# Patient Record
Sex: Male | Born: 1975 | Race: Black or African American | Hispanic: No | Marital: Married | Smoking: Former smoker
Health system: Southern US, Community
[De-identification: ages and names within clinical notes are randomized; demographics above are authoritative.]

## PROBLEM LIST (undated history)

## (undated) DIAGNOSIS — I10 Essential (primary) hypertension: Secondary | ICD-10-CM

---

## 2000-06-25 ENCOUNTER — Encounter: Payer: Self-pay | Admitting: Emergency Medicine

## 2000-06-25 ENCOUNTER — Emergency Department (HOSPITAL_COMMUNITY): Admission: EM | Admit: 2000-06-25 | Discharge: 2000-06-25 | Payer: Self-pay | Admitting: Emergency Medicine

## 2000-11-14 ENCOUNTER — Encounter (INDEPENDENT_AMBULATORY_CARE_PROVIDER_SITE_OTHER): Payer: Self-pay | Admitting: *Deleted

## 2000-11-14 ENCOUNTER — Inpatient Hospital Stay (HOSPITAL_COMMUNITY): Admission: EM | Admit: 2000-11-14 | Discharge: 2000-11-16 | Payer: Self-pay

## 2000-11-18 ENCOUNTER — Emergency Department (HOSPITAL_COMMUNITY): Admission: EM | Admit: 2000-11-18 | Discharge: 2000-11-18 | Payer: Self-pay | Admitting: Emergency Medicine

## 2003-07-04 ENCOUNTER — Emergency Department (HOSPITAL_COMMUNITY): Admission: EM | Admit: 2003-07-04 | Discharge: 2003-07-04 | Payer: Self-pay | Admitting: Emergency Medicine

## 2005-02-22 ENCOUNTER — Emergency Department (HOSPITAL_COMMUNITY): Admission: EM | Admit: 2005-02-22 | Discharge: 2005-02-22 | Payer: Self-pay | Admitting: Emergency Medicine

## 2006-01-24 ENCOUNTER — Emergency Department (HOSPITAL_COMMUNITY): Admission: EM | Admit: 2006-01-24 | Discharge: 2006-01-24 | Payer: Self-pay | Admitting: Emergency Medicine

## 2006-11-17 ENCOUNTER — Emergency Department (HOSPITAL_COMMUNITY): Admission: EM | Admit: 2006-11-17 | Discharge: 2006-11-17 | Payer: Self-pay | Admitting: Emergency Medicine

## 2007-01-19 IMAGING — CT CT PELVIS W/ CM
1 of 4 series · 14 of 32 positions shown, 19 images · IV contrast (omnipaque)
Comparison: 07/04/03.

CLINICAL DATA: Abdominal and pelvic pain.   
 ABDOMEN CT WITH CONTRAST:
TECHNIQUE: Multidetector CT imaging of the abdomen was performed following the standard protocol during bolus administration of intravenous contrast.
 Contrast:   100 cc Omnipaque 300 IV.
TECHNIQUE: Multidetector CT imaging of the pelvis was performed following the standard protocol during bolus administration of intravenous contrast.

[Series 2: abd_pel 5.0 b40f st · axial · 0.77mm/px · z∈[+907,+1357]mm · 14 of 103 slices shown, 19 images]
[im 7/103  soft-tissue]
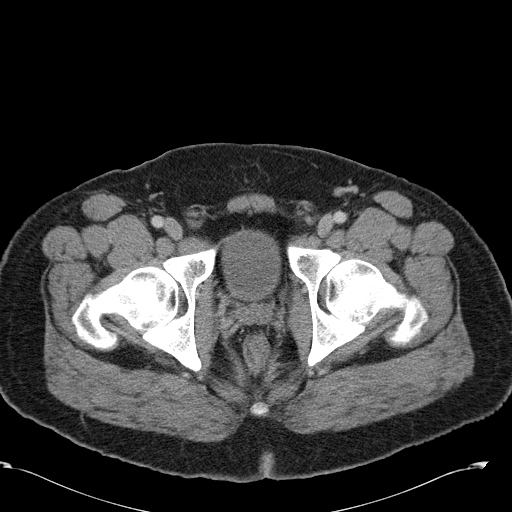
[im 7/103  bone]
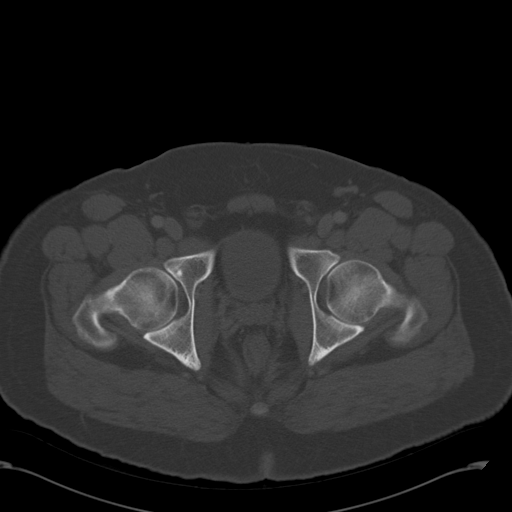
[im 13/103  soft-tissue]
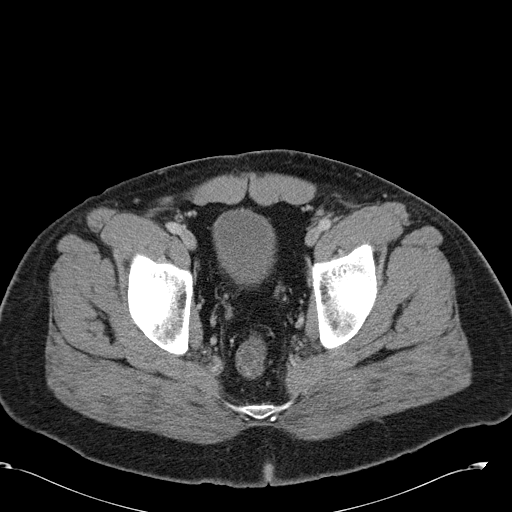
[im 25/103  soft-tissue]
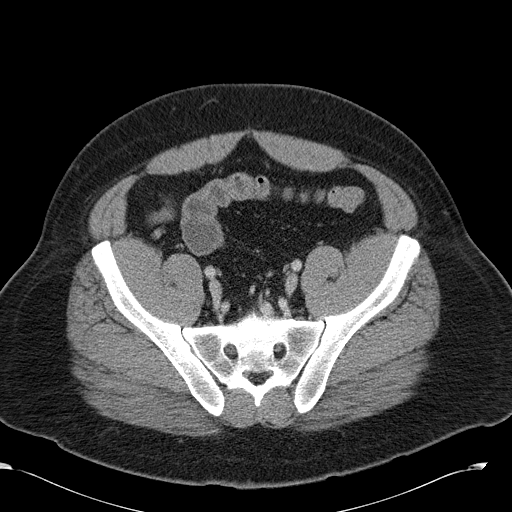
[im 31/103  soft-tissue]
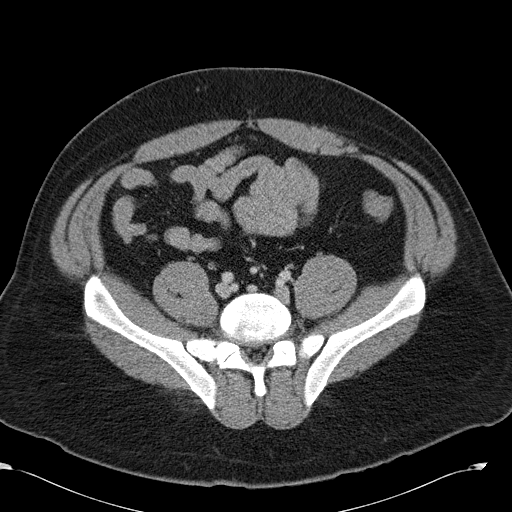
[im 37/103  soft-tissue]
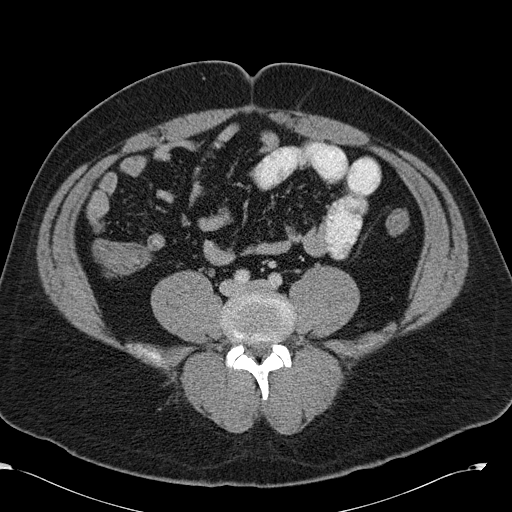
[im 43/103  soft-tissue]
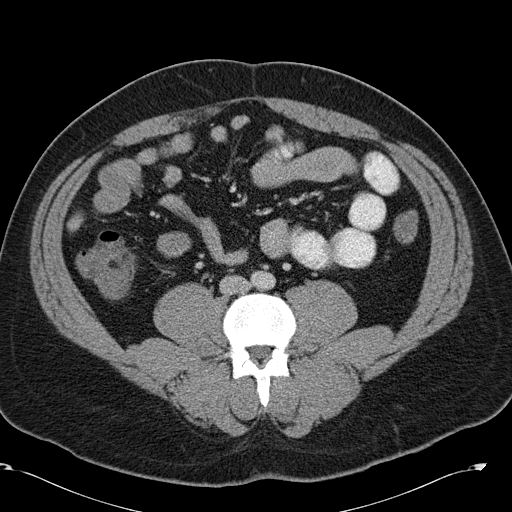
[im 55/103  soft-tissue]
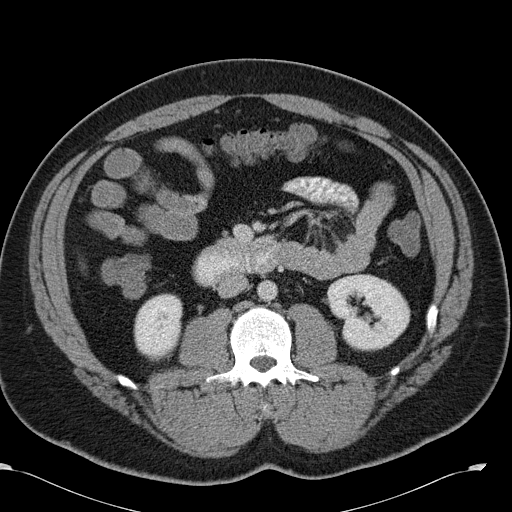
[im 61/103  soft-tissue]
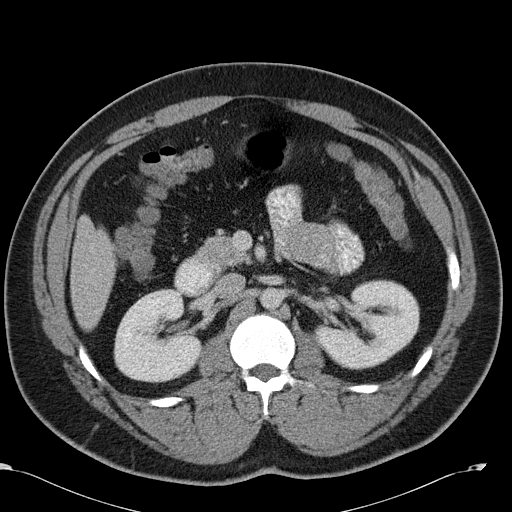
[im 67/103  soft-tissue]
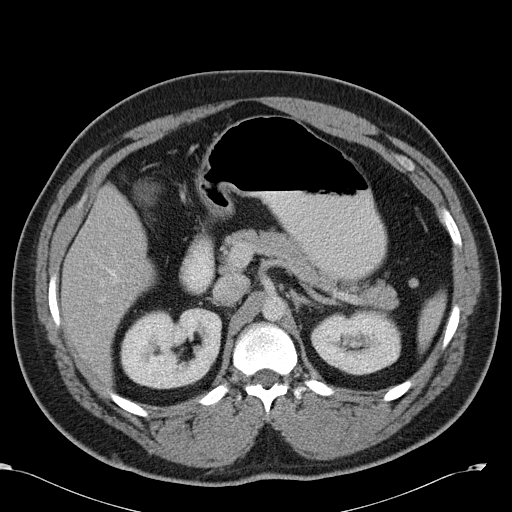
[im 67/103  bone]
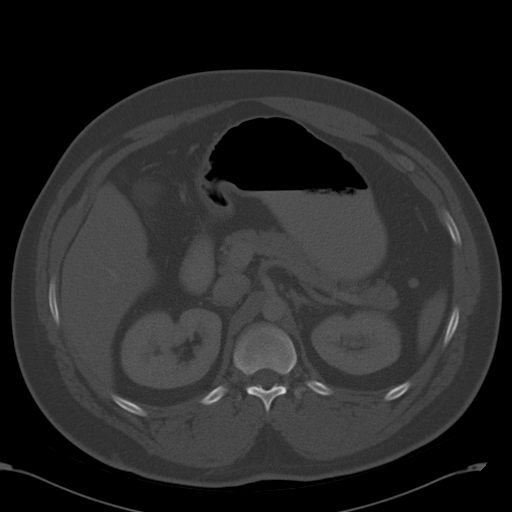
[im 73/103  soft-tissue]
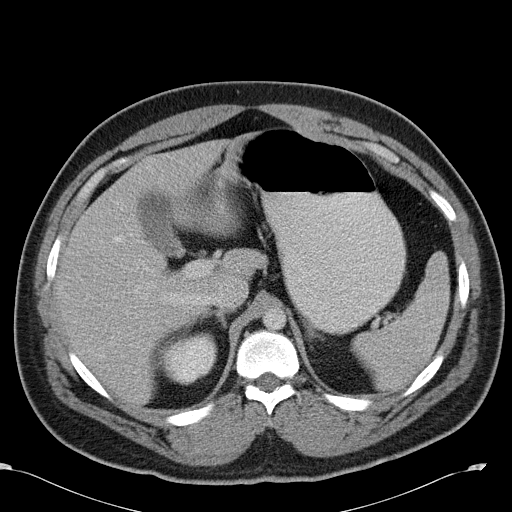
[im 79/103  soft-tissue]
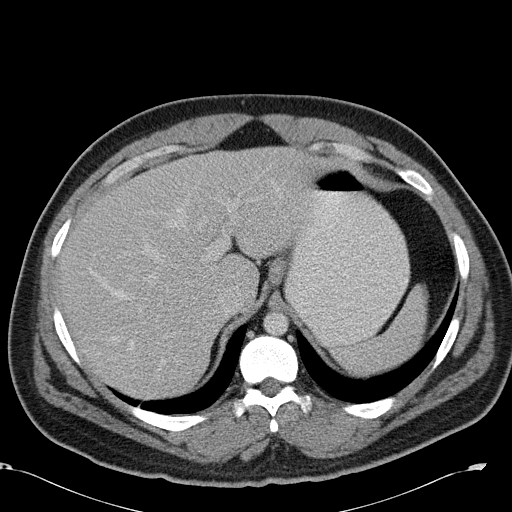
[im 79/103  lung]
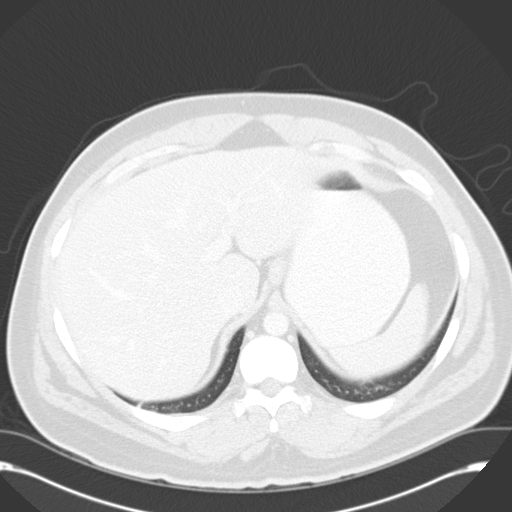
[im 85/103  lung]
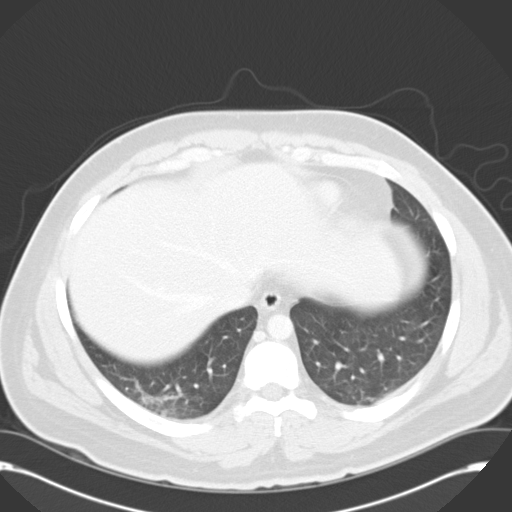
[im 91/103  soft-tissue]
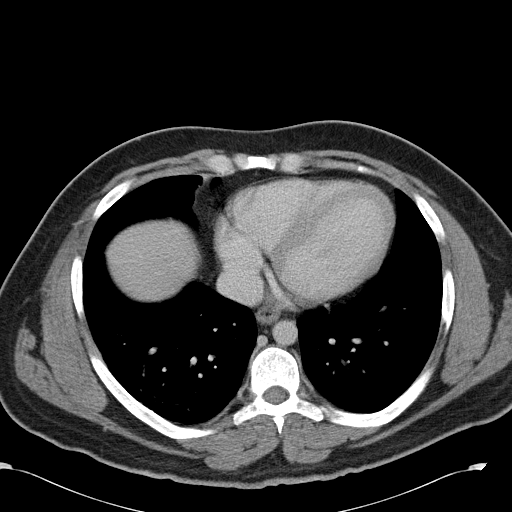
[im 91/103  lung]
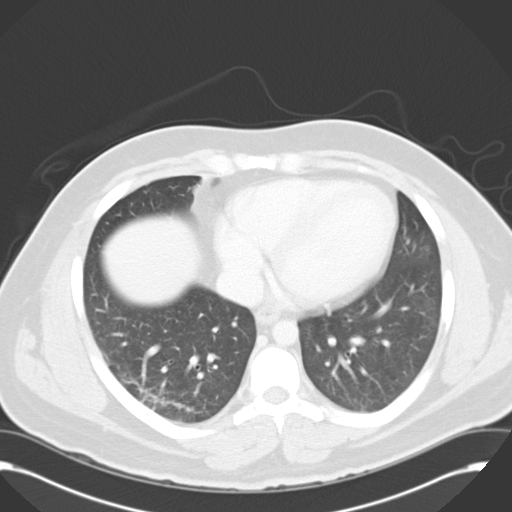
[im 97/103  soft-tissue]
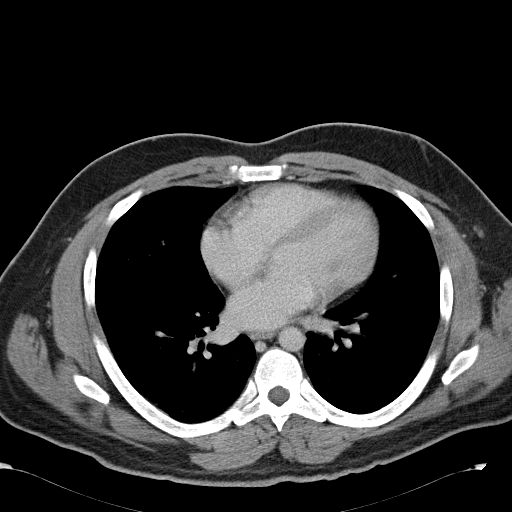
[im 97/103  lung]
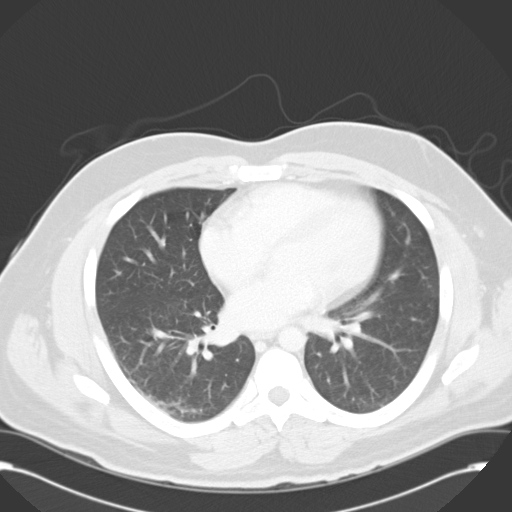

[14 of 32 positions shown; findings below may reference images not displayed]

FINDINGS: Mild bibasilar scarring is identified.  The liver, spleen, adrenal glands, gallbladder, pancreas, and kidneys are unremarkable.  No evidence of enlarged lymph nodes or free fluid.  There is no evidence of free fluid, enlarged lymph nodes, abdominal aortic aneurysm, or biliary dilatation.  The visualized bowel is within normal limits.
IMPRESSION: No acute abnormality.   
 PELVIS CT WITH CONTRAST:
FINDINGS: The bladder, bowel, and appendix are unremarkable.  There is no evidence of free fluid or enlarged lymph nodes.
IMPRESSION: No acute abnormality.

## 2007-12-21 IMAGING — US US ABDOMEN COMPLETE
1 series · 14 of 25 positions shown · non-contrast
Comparison: none

CLINICAL DATA: Abdominal pain. ER patient.
 ABDOMEN ULTRASOUND:
TECHNIQUE: Complete abdominal ultrasound examination was performed including evaluation of the liver, gallbladder, bile ducts, pancreas, kidneys, spleen, IVC, and abdominal aorta.

[Series 1: unknown · 0.37mm/px · 14 of 83 slices shown]
[im 1/83]
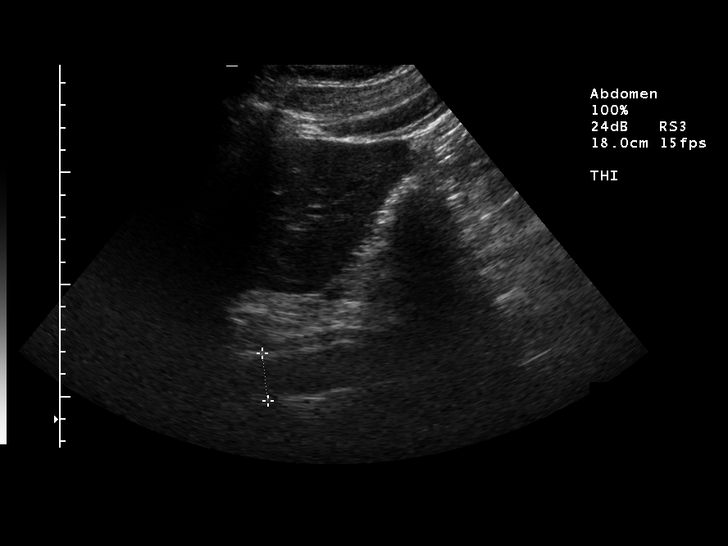
[im 7/83]
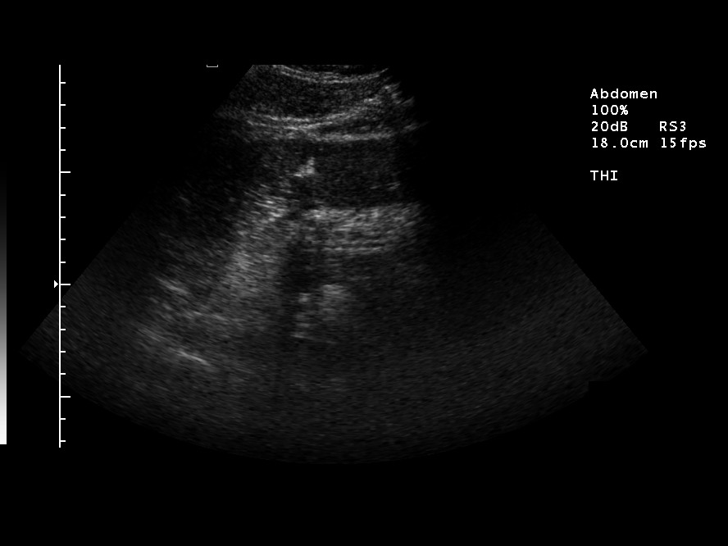
[im 14/83]
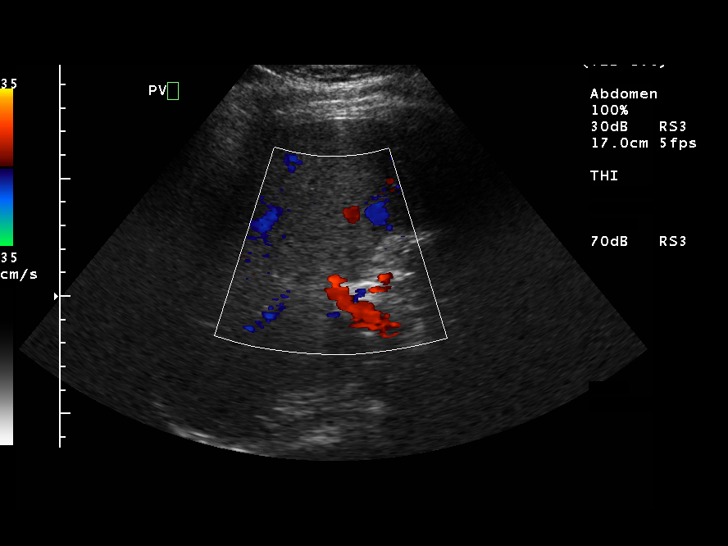
[im 21/83]
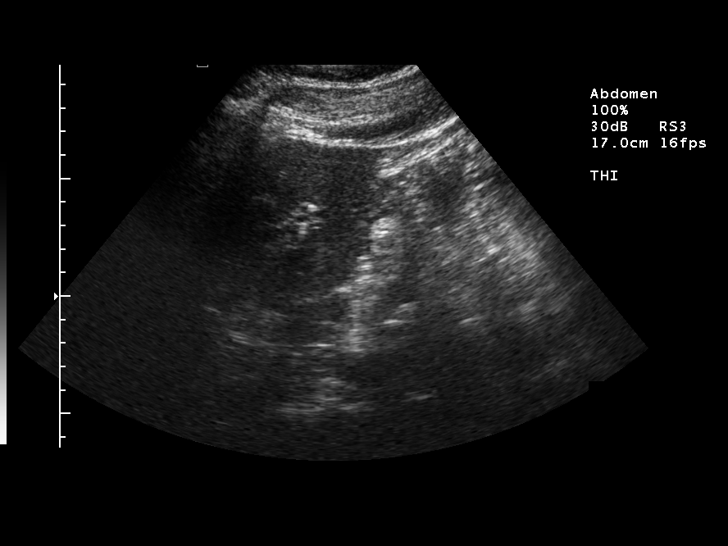
[im 28/83]
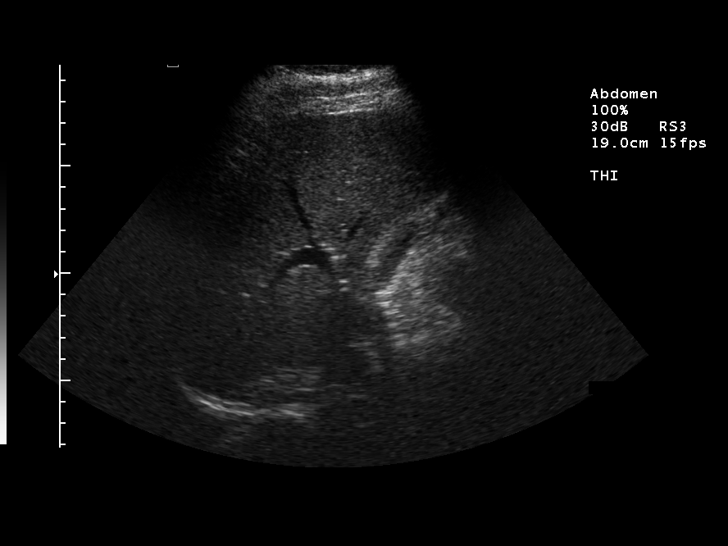
[im 31/83]
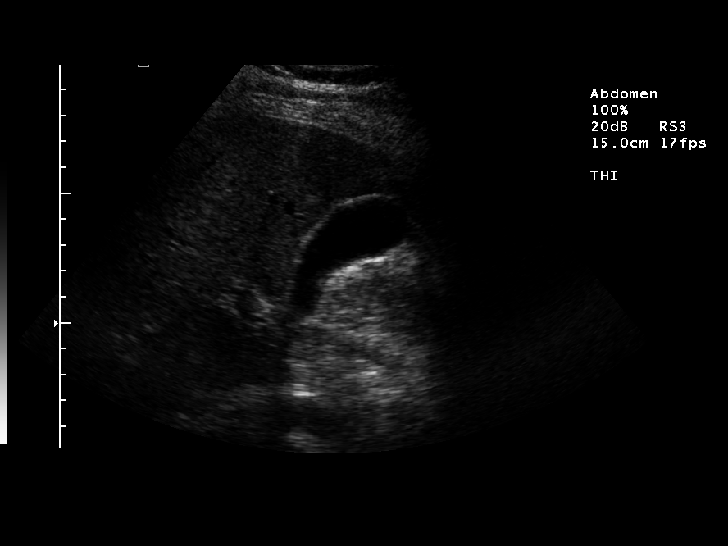
[im 38/83]
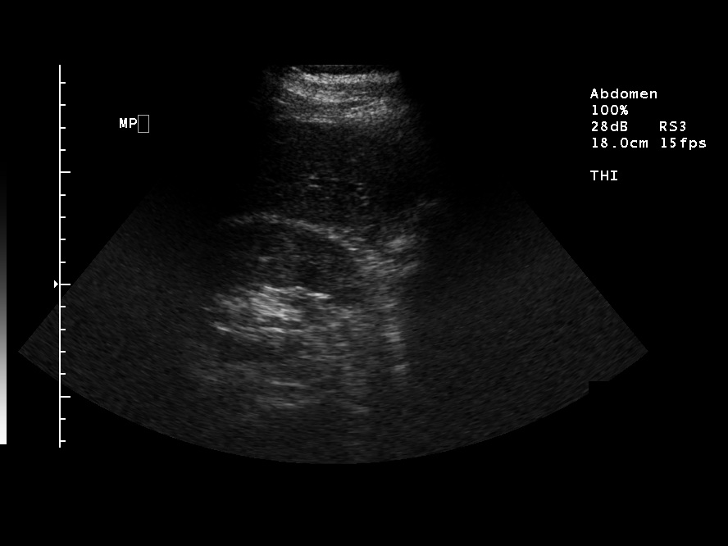
[im 45/83]
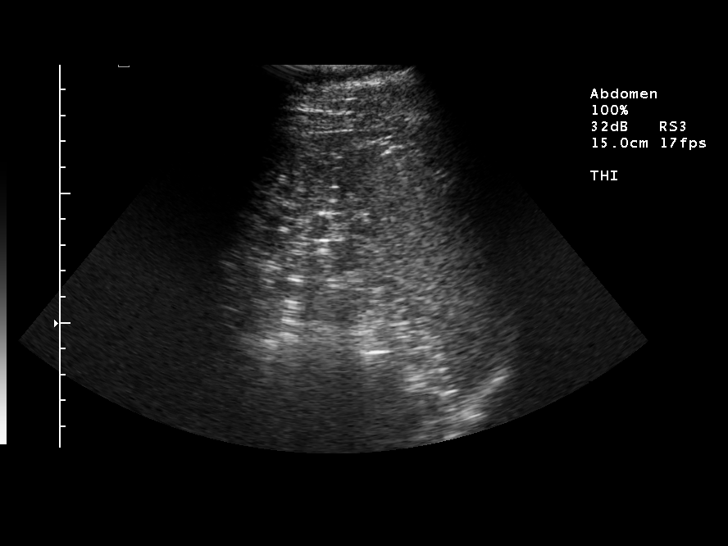
[im 52/83]
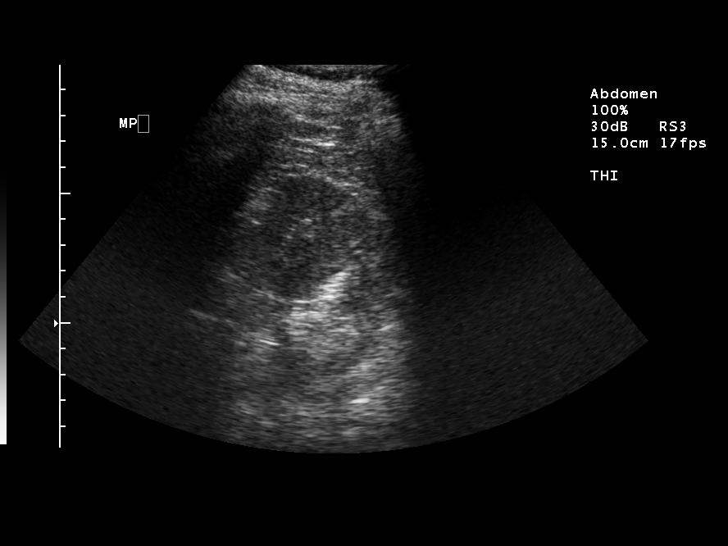
[im 55/83]
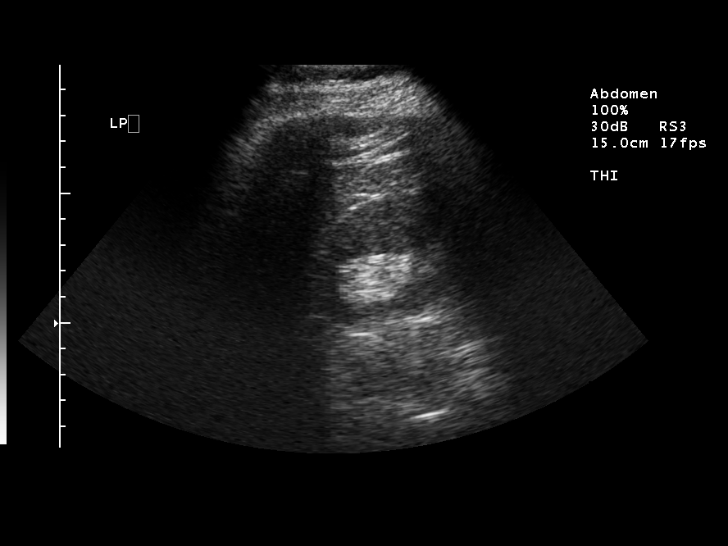
[im 62/83]
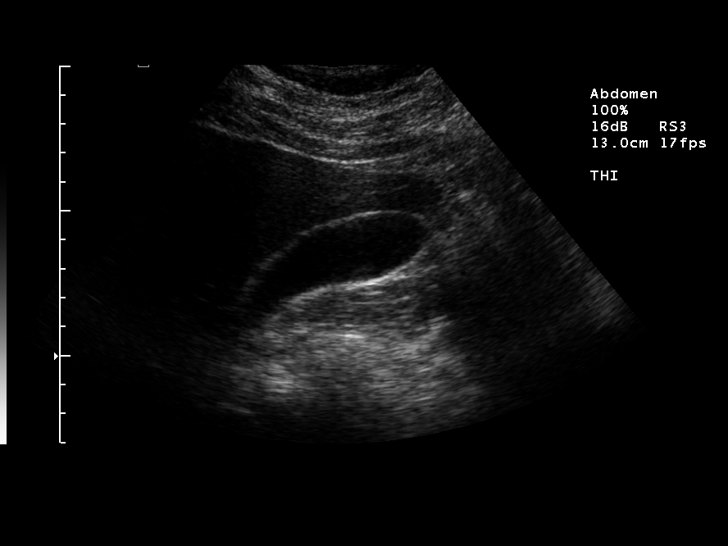
[im 69/83]
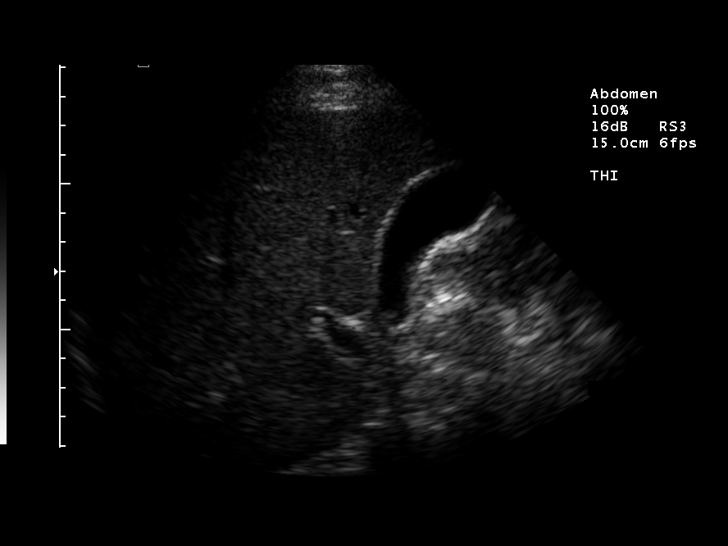
[im 76/83]
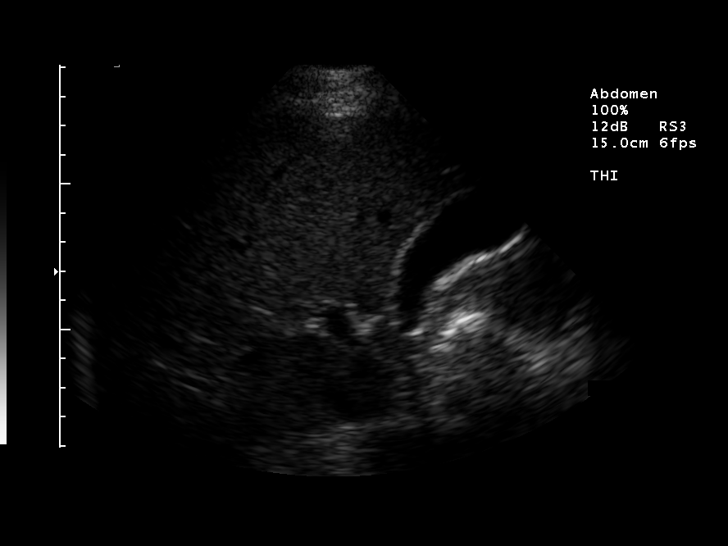
[im 83/83]
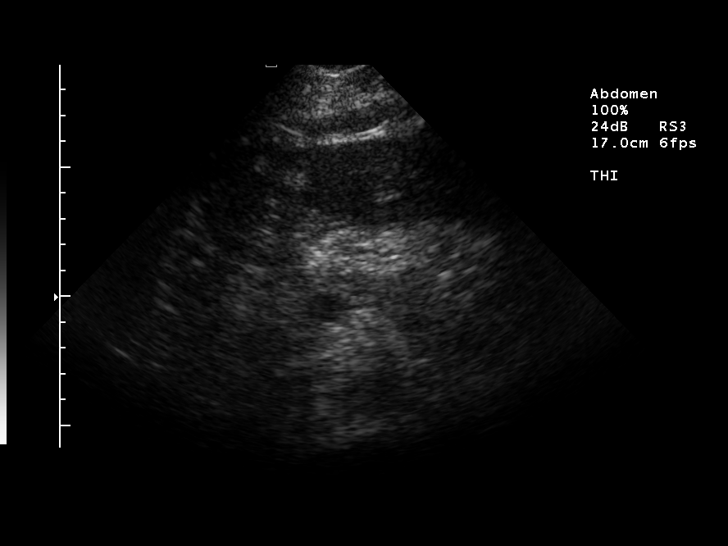

[14 of 25 positions shown; findings below may reference images not displayed]

FINDINGS: Normal gallbladder.  Gallbladder wall thickness is 2.1 mm. Common duct measures 3.0 mm which is within normal limits. No hepatic, splenic or renal abnormality.  The spleen measures 8.7 cm in diameter and appears unremarkable. Right and left kidneys measure 11.5 cm and 11.6 cm in length, respectively. There is poor visualization of the pancreas.  Patent IVC.  Maximum diameter of the abdominal aorta is 2.1 cm.
IMPRESSION: Negative abdominal ultrasound. However, note that the pancreas was suboptimally visualized.

## 2010-11-24 NOTE — H&P (Signed)
[place]  Patient:    Erik Dean, Erik Dean                      MRN: [id number] Adm. Date:  [id number] Attending:  Mingo Amber                         History and Physical  HISTORY OF PRESENT ILLNESS:  Erik Dean is a 35 year old male who presented to the emergency room with a three-day history of epigastric pain with vomiting of clear liquids and diarrhea which he describes as initially watery but becoming reddish in color. His usual bowel habits are one stool once or twice daily. He has not had fevers with this illness, and he describes no prior episodes of a similar problem. It is noted that he presented to the emergency room once in December with vomiting which he says was self-limited. X-rays at that time were unremarkable. He denies fevers, chills, weight loss, dysphagia, heartburn, or history of abdominal surgery. He describes pain in the mid abdomen which is colicky. This, on presentation to the emergency room, was described as a 10/10 and is currently a 6/10. He has received Demerol for discomfort.  PAST MEDICAL HISTORY:  Fairly unremarkable. He has never been hospitalized.  PAST SURGICAL HISTORY:  He has had no abdominal surgery.  CURRENT MEDICATIONS:  He is not taking any chronic medications.  ALLERGIES:  None reported.  FAMILY HISTORY:  Pertinent for a mother with diabetes. There is no known family history of ulcers, gallstones, inflammatory bowel disease, or gastrointestinal neoplasia.  SOCIAL HISTORY:  He is a part-time Corporate investment banker. He has a live-in girlfriend and a young daughter. He smokes a half of a pack of cigarettes per day and has a few beers per day. It is noted that his drug screen is positive for marijuana. He denies any other drug use at present.  REVIEW OF SYSTEMS:  GENERAL:  No weight loss or night sweats. ENDOCRINE:  No history of diabetes or thyroid problems. SKIN:  No rash or pruritus. EYES:   No icterus or change in vision. ENT:  No aphthous ulcers or chronic sore throat. RESPIRATORY:  No shortness of breath, cough, or wheezing. CARDIAC:  No chest pain, palpitations, or history of valvular heart disease. GI:  As above. GU: No dysuria or hematuria. The remainder of the review of systems is negative.  PHYSICAL EXAMINATION:  GENERAL:  He is an overweight, young adult male in no acute distress at this time.  VITAL SIGNS:  Afebrile, with a blood pressure of 139/74, pulse is 93, respiratory rate 18.  SKIN:  Normal.  HEENT:  Eyes are anicteric. Oropharynx is unremarkable.  NECK:  Supple without thyromegaly.  LYMPH NODES:  There is no cervical or inguinal adenopathy.  CHEST:  Clear.  CARDIAC:  Regular rate and rhythm without murmurs, rubs, or gallops.  ABDOMEN:  Obese with supraumbilical tenderness. I do not appreciate any mass or organomegaly. There is no rebound or hernia. There are active bowel sounds.  RECTAL:  Is not performed.  EXTREMITIES:  Without clubbing, cyanosis, edema, or rash.  LABORATORY DATA:  Hemoglobin of 16, white blood count of 14, platelet count is normal. Electrolytes are within normal limits, BUN 8, albumin 3.7, SGPT normal at 34. Drug screen is positive for marijuana. Urinalysis is normal.  IMPRESSION:  A 35 year old male with seeming acute colitis. This might be infectious in etiology, or  it could be onset of ulcerative colitis. His CT demonstrates mild distention of the small bowel with diffuse colonic wall thickening mostly in the ascending and transverse colon with a normal terminal ileum.  PLAN:  I am going to treat him with IV Tequin and oral Flagyl while awaiting stool cultures and C. difficile toxin titer. Colonoscopy is scheduled for the morning to rule out ulcerative colitis. Please see the orders. DD:  11/14/00 TD:  11/14/00 Job: [id number]

## 2013-09-29 ENCOUNTER — Encounter (HOSPITAL_COMMUNITY): Payer: Self-pay | Admitting: Emergency Medicine

## 2013-09-29 ENCOUNTER — Emergency Department (INDEPENDENT_AMBULATORY_CARE_PROVIDER_SITE_OTHER)
Admission: EM | Admit: 2013-09-29 | Discharge: 2013-09-29 | Disposition: A | Discharge status: Home / Self Care | Payer: Self-pay | Source: Home / Self Care | Attending: Emergency Medicine | Admitting: Emergency Medicine

## 2013-09-29 DIAGNOSIS — I10 Essential (primary) hypertension: Secondary | ICD-10-CM

## 2013-09-29 MED ORDER — AMLODIPINE BESYLATE 5 MG PO TABS: 5.0000 mg | ORAL_TABLET | Freq: Every day | ORAL | Status: DC

## 2013-09-29 NOTE — ED Provider Notes (Signed)
CSN: [id number]     Arrival date & time 09/29/13  1139 History   First MD Initiated Contact with Patient 09/29/13 1311     Chief Complaint  Patient presents with  . Hypertension   (Consider location/radiation/quality/duration/timing/severity/associated sxs/prior Treatment) HPI Comments: Was recently released from 7 years in prison on 09/06/2013 and has recently begun work at [employer]. Went for pre-employment evaluation and was informed that while all other component of his exam were normal, his blood pressure was elevated. So, he presents here for treatment of hypertension.  Reports himself to be asymptomatic.  Patient is a 38 y.o. male presenting with hypertension. The history is provided by the patient.  Hypertension This is a new problem. The problem occurs constantly. The problem has not changed since onset.Pertinent negatives include no chest pain, no abdominal pain, no headaches and no shortness of breath. Associated symptoms comments: No pedal edema.    History reviewed. No pertinent past medical history. History reviewed. No pertinent past surgical history. History reviewed. No pertinent family history. History  Substance Use Topics  . Smoking status: Not on file  . Smokeless tobacco: Not on file  . Alcohol Use: No    Review of Systems  Respiratory: Negative for shortness of breath.   Cardiovascular: Negative for chest pain.  Gastrointestinal: Negative for abdominal pain.  Neurological: Negative for headaches.  All other systems reviewed and are negative.    Allergies  Review of patient's allergies indicates no known allergies.  Home Medications   Current Outpatient Rx  Name  Route  Sig  Dispense  Refill  . amLODipine (NORVASC) 5 MG tablet   Oral   Take 1 tablet (5 mg total) by mouth daily.   30 tablet   1    BP 170/110  Pulse 78  Temp(Src) 98.6 F (37 C) (Oral)  Resp 16  SpO2 98% Physical Exam  Nursing note and vitals reviewed. Constitutional: He  is oriented to person, place, and time. He appears well-developed and well-nourished. No distress.  +overweight  HENT:  Head: Normocephalic and atraumatic.  Right Ear: External ear normal.  Left Ear: External ear normal.  Nose: Nose normal.  Eyes: Conjunctivae are normal. No scleral icterus.  Neck: Normal range of motion. Neck supple. No JVD present.  Cardiovascular: Normal rate, regular rhythm and normal heart sounds.   Pulmonary/Chest: Effort normal and breath sounds normal.  Abdominal: Bowel sounds are normal. He exhibits no distension. There is no tenderness.  Musculoskeletal: Normal range of motion. He exhibits no edema and no tenderness.  Neurological: He is alert and oriented to person, place, and time.  Skin: Skin is warm and dry.  Psychiatric: He has a normal mood and affect. His behavior is normal.    ED Course  Procedures (including critical care time) Labs Review Labs Reviewed - No data to display Imaging Review No results found.   MDM   1. Hypertension    Patient [facility]. Will initiate therapy with Norvasc and advise 7-10 day follow up for BP recheck.    Jess BartersJennifer Lee [place], [place] 09/29/13 1340

## 2013-09-29 NOTE — ED Notes (Signed)
Pt  Was  Told  That  His  Blood pressure  Was  High  While  Getting a  Physical  For a  Job     He  Displays  No  Symptoms

## 2013-09-29 NOTE — Discharge Instructions (Signed)
Arterial Hypertension  Arterial hypertension (high blood pressure) is a condition of elevated pressure in your blood vessels. Hypertension over a long period of time is a risk factor for strokes, heart attacks, and heart failure. It is also the leading cause of kidney (renal) failure.   CAUSES   · In Adults -- Over 90% of all hypertension has no known cause. This is called essential or primary hypertension. In the other 10% of people with hypertension, the increase in blood pressure is caused by another disorder. This is called secondary hypertension. Important causes of secondary hypertension are:  · Heavy alcohol use.  · Obstructive sleep apnea.  · Hyperaldosterosim (Conn's syndrome).  · Steroid use.  · Chronic kidney failure.  · Hyperparathyroidism.  · Medications.  · Renal artery stenosis.  · Pheochromocytoma.  · Cushing's disease.  · Coarctation of the aorta.  · Scleroderma renal crisis.  · Licorice (in excessive amounts).  · Drugs (cocaine, methamphetamine).  Your caregiver can explain any items above that apply to you.  · In Children -- Secondary hypertension is more common and should always be considered.  · Pregnancy -- Few women of childbearing age have high blood pressure. However, up to 10% of them develop hypertension of pregnancy. Generally, this will not harm the woman. It may be a sign of 3 complications of pregnancy: preeclampsia, HELLP syndrome, and eclampsia. Follow up and control with medication is necessary.  SYMPTOMS   · This condition normally does not produce any noticeable symptoms. It is usually found during a routine exam.  · Malignant hypertension is a late problem of high blood pressure. It may have the following symptoms:  · Headaches.  · Blurred vision.  · End-organ damage (this means your kidneys, heart, lungs, and other organs are being damaged).  · Stressful situations can increase the blood pressure. If a person with normal blood pressure has their blood pressure go up while being  seen by their caregiver, this is often termed "white coat hypertension." Its importance is not known. It may be related with eventually developing hypertension or complications of hypertension.  · Hypertension is often confused with mental tension, stress, and anxiety.  DIAGNOSIS   The diagnosis is made by 3 separate blood pressure measurements. They are taken at least 1 week apart from each other. If there is organ damage from hypertension, the diagnosis may be made without repeat measurements.  Hypertension is usually identified by having blood pressure readings:  · Above 140/90 mmHg measured in both arms, at 3 separate times, over a couple weeks.  · Over 130/80 mmHg should be considered a risk factor and may require treatment in patients with diabetes.  Blood pressure readings over 120/80 mmHg are called "pre-hypertension" even in non-diabetic patients.  To get a true blood pressure measurement, use the following guidelines. Be aware of the factors that can alter blood pressure readings.  · Take measurements at least 1 hour after caffeine.  · Take measurements 30 minutes after smoking and without any stress. This is another reason to quit smoking  it raises your blood pressure.  · Use a proper cuff size. Ask your caregiver if you are not sure about your cuff size.  · Most home blood pressure cuffs are automatic. They will measure systolic and diastolic pressures. The systolic pressure is the pressure reading at the start of sounds. Diastolic pressure is the pressure at which the sounds disappear. If you are elderly, measure pressures in multiple postures. Try sitting, lying or   standing.  · Sit at rest for a minimum of 5 minutes before taking measurements.  · You should not be on any medications like decongestants. These are found in many cold medications.  · Record your blood pressure readings and review them with your caregiver.  If you have hypertension:  · Your caregiver may do tests to be sure you do not have  secondary hypertension (see "causes" above).  · Your caregiver may also look for signs of metabolic syndrome. This is also called Syndrome X or Insulin Resistance Syndrome. You may have this syndrome if you have type 2 diabetes, abdominal obesity, and abnormal blood lipids in addition to hypertension.  · Your caregiver will take your medical and family history and perform a physical exam.  · Diagnostic tests may include blood tests (for glucose, cholesterol, potassium, and kidney function), a urinalysis, or an EKG. Other tests may also be necessary depending on your condition.  PREVENTION   There are important lifestyle issues that you can adopt to reduce your chance of developing hypertension:  · Maintain a normal weight.  · Limit the amount of salt (sodium) in your diet.  · Exercise often.  · Limit alcohol intake.  · Get enough potassium in your diet. Discuss specific advice with your caregiver.  · Follow a DASH diet (dietary approaches to stop hypertension). This diet is rich in fruits, vegetables, and low-fat dairy products, and avoids certain fats.  PROGNOSIS   Essential hypertension cannot be cured. Lifestyle changes and medical treatment can lower blood pressure and reduce complications. The prognosis of secondary hypertension depends on the underlying cause. Many people whose hypertension is controlled with medicine or lifestyle changes can live a normal, healthy life.   RISKS AND COMPLICATIONS   While high blood pressure alone is not an illness, it often requires treatment due to its short- and long-term effects on many organs. Hypertension increases your risk for:  · CVAs or strokes (cerebrovascular accident).  · Heart failure due to chronically high blood pressure (hypertensive cardiomyopathy).  · Heart attack (myocardial infarction).  · Damage to the retina (hypertensive retinopathy).  · Kidney failure (hypertensive nephropathy).  Your caregiver can explain list items above that apply to you. Treatment  of hypertension can significantly reduce the risk of complications.  TREATMENT   · For overweight patients, weight loss and regular exercise are recommended. Physical fitness lowers blood pressure.  · Mild hypertension is usually treated with diet and exercise. A diet rich in fruits and vegetables, fat-free dairy products, and foods low in fat and salt (sodium) can help lower blood pressure. Decreasing salt intake decreases blood pressure in a 1/3 of people.  · Stop smoking if you are a smoker.  The steps above are highly effective in reducing blood pressure. While these actions are easy to suggest, they are difficult to achieve. Most patients with moderate or severe hypertension end up requiring medications to bring their blood pressure down to a normal level. There are several classes of medications for treatment. Blood pressure pills (antihypertensives) will lower blood pressure by their different actions. Lowering the blood pressure by 10 mmHg may decrease the risk of complications by as much as 25%.  The goal of treatment is effective blood pressure control. This will reduce your risk for complications. Your caregiver will help you determine the best treatment for you according to your lifestyle. What is excellent treatment for one person, may not be for you.  HOME CARE INSTRUCTIONS   · Do not   smoke.  · Follow the lifestyle changes outlined in the "Prevention" section.  · If you are on medications, follow the directions carefully. Blood pressure medications must be taken as prescribed. Skipping doses reduces their benefit. It also puts you at risk for problems.  · Follow up with your caregiver, as directed.  · If you are asked to monitor your blood pressure at home, follow the guidelines in the "Diagnosis" section above.  SEEK MEDICAL CARE IF:   · You think you are having medication side effects.  · You have recurrent headaches or lightheadedness.  · You have swelling in your ankles.  · You have trouble with  your vision.  SEEK IMMEDIATE MEDICAL CARE IF:   · You have sudden onset of chest pain or pressure, difficulty breathing, or other symptoms of a heart attack.  · You have a severe headache.  · You have symptoms of a stroke (such as sudden weakness, difficulty speaking, difficulty walking).  MAKE SURE YOU:   · Understand these instructions.  · Will watch your condition.  · Will get help right away if you are not doing well or get worse.  Document Released: 06/25/2005 Document Revised: 09/17/2011 Document Reviewed: 01/23/2007  ExitCare® Patient Information ©2014 [employer].  DASH Diet  The DASH diet stands for "Dietary Approaches to Stop Hypertension." It is a healthy eating plan that has been shown to reduce high blood pressure (hypertension) in as little as 14 days, while also possibly providing other significant health benefits. These other health benefits include reducing the risk of breast cancer after menopause and reducing the risk of type 2 diabetes, heart disease, colon cancer, and stroke. Health benefits also include weight loss and slowing kidney failure in patients with chronic kidney disease.   DIET GUIDELINES  · Limit salt (sodium). Your diet should contain less than 1500 mg of sodium daily.  · Limit refined or processed carbohydrates. Your diet should include mostly whole grains. Desserts and added sugars should be used sparingly.  · Include small amounts of heart-healthy fats. These types of fats include nuts, oils, and tub margarine. Limit saturated and trans fats. These fats have been shown to be harmful in the body.  CHOOSING FOODS   The following food groups are based on a 2000 calorie diet. See your Registered Dietitian for individual calorie needs.  Grains and Grain Products (6 to 8 servings daily)  · Eat More Often: Whole-wheat bread, brown rice, whole-grain or wheat pasta, quinoa, popcorn without added fat or salt (air popped).  · Eat Less Often: White bread, white pasta, white rice,  cornbread.  Vegetables (4 to 5 servings daily)  · Eat More Often: Fresh, frozen, and canned vegetables. Vegetables may be raw, steamed, roasted, or grilled with a minimal amount of fat.  · Eat Less Often/Avoid: Creamed or fried vegetables. Vegetables in a cheese sauce.  Fruit (4 to 5 servings daily)  · Eat More Often: All fresh, canned (in natural juice), or frozen fruits. Dried fruits without added sugar. One hundred percent fruit juice (½ cup [237 mL] daily).  · Eat Less Often: Dried fruits with added sugar. Canned fruit in light or heavy syrup.  Lean Meats, Fish, and Poultry (2 servings or less daily. One serving is 3 to 4 oz [85-114 g]).  · Eat More Often: Ninety percent or leaner ground beef, tenderloin, sirloin. Round cuts of beef, chicken breast, turkey breast. All fish. Grill, bake, or broil your meat. Nothing should be fried.  · Eat   Less Often/Avoid: Fatty cuts of meat, turkey, or chicken leg, thigh, or wing. Fried cuts of meat or fish.  Dairy (2 to 3 servings)  · Eat More Often: Low-fat or fat-free milk, low-fat plain or light yogurt, reduced-fat or part-skim cheese.  · Eat Less Often/Avoid: Milk (whole, 2%). Whole milk yogurt. Full-fat cheeses.  Nuts, Seeds, and Legumes (4 to 5 servings per week)  · Eat More Often: All without added salt.  · Eat Less Often/Avoid: Salted nuts and seeds, canned beans with added salt.  Fats and Sweets (limited)  · Eat More Often: Vegetable oils, tub margarines without trans fats, sugar-free gelatin. Mayonnaise and salad dressings.  · Eat Less Often/Avoid: Coconut oils, palm oils, butter, stick margarine, cream, half and half, cookies, candy, pie.  FOR MORE INFORMATION  The Dash Diet Eating Plan: www.dashdiet.org  Document Released: 06/14/2011 Document Revised: 09/17/2011 Document Reviewed: 06/14/2011  ExitCare® Patient Information ©2014 [employer].

## 2013-09-29 NOTE — ED Provider Notes (Signed)
Medical screening examination/treatment/procedure(s) were performed by non-physician practitioner and as supervising physician I was immediately available for consultation/collaboration.  Latrel Szymczak, M.D.  Timira Bieda C Zanaiya Calabria, MD 09/29/13 1807 

## 2013-10-13 ENCOUNTER — Emergency Department (INDEPENDENT_AMBULATORY_CARE_PROVIDER_SITE_OTHER)
Admission: EM | Admit: 2013-10-13 | Discharge: 2013-10-13 | Disposition: A | Discharge status: Home / Self Care | Payer: Self-pay | Source: Home / Self Care | Attending: Family Medicine | Admitting: Family Medicine

## 2013-10-13 ENCOUNTER — Encounter (HOSPITAL_COMMUNITY): Payer: Self-pay | Admitting: Emergency Medicine

## 2013-10-13 DIAGNOSIS — I10 Essential (primary) hypertension: Secondary | ICD-10-CM

## 2013-10-13 HISTORY — DX: Essential (primary) hypertension: I10

## 2013-10-13 MED ORDER — LISINOPRIL-HYDROCHLOROTHIAZIDE 10-12.5 MG PO TABS: 1.0000 | ORAL_TABLET | Freq: Every day | ORAL | Status: AC

## 2013-10-13 NOTE — Discharge Instructions (Signed)
Take medicine as prescribed and see your doctor for refills and bp clearance for work.

## 2013-10-13 NOTE — ED Notes (Signed)
Erik Dean  Spoke   With  Pt     Regarding plan of  Care

## 2013-10-13 NOTE — ED Notes (Signed)
Dr Zollie Scalekindl  Spoke   With the   Doctor at   [facility]up[facility]    Regarding  pts  Plan of  Care       Pt  Has       A   Document    Dated    18  March    That  He  Is  Requesting     To  Have  Filled out that     Dr  Artis FlockKindl  Says     Needs  To be filled  Out  By a  PCP

## 2013-10-13 NOTE — ED Provider Notes (Signed)
CSN: [id number]     Arrival date & time 10/13/13  0850 History   First MD Initiated Contact with Patient 10/13/13 [id number]     Chief Complaint  Patient presents with  . Hypertension   (Consider location/radiation/quality/duration/timing/severity/associated sxs/prior Treatment) Patient is a 38 y.o. male presenting with hypertension. The history is provided by the patient.  Hypertension This is a new problem. The current episode started more than 1 week ago (seen and treated 3/24 for bp, here for recheck.). The problem has not changed since onset.Pertinent negatives include no chest pain, no headaches and no shortness of breath.    No past medical history on file. No past surgical history on file. No family history on file. History  Substance Use Topics  . Smoking status: Not on file  . Smokeless tobacco: Not on file  . Alcohol Use: No    Review of Systems  Constitutional: Negative.   Respiratory: Negative.  Negative for shortness of breath.   Cardiovascular: Negative.  Negative for chest pain, palpitations and leg swelling.  Neurological: Negative for headaches.    Allergies  Review of patient's allergies indicates no known allergies.  Home Medications   Current Outpatient Rx  Name  Route  Sig  Dispense  Refill  . amLODipine (NORVASC) 5 MG tablet   Oral   Take 1 tablet (5 mg total) by mouth daily.   30 tablet   1   . lisinopril-hydrochlorothiazide (PRINZIDE,ZESTORETIC) 10-12.5 MG per tablet   Oral   Take 1 tablet by mouth daily.   30 tablet   1    BP 152/102  Pulse 92  Temp(Src) 97.3 F (36.3 C) (Oral)  Resp 18  SpO2 98% Physical Exam  Nursing note and vitals reviewed. Constitutional: He appears well-developed and well-nourished.  Neck: Normal range of motion. Neck supple.  Cardiovascular: Normal rate, normal heart sounds and intact distal pulses.   Pulmonary/Chest: Effort normal and breath sounds normal.  Skin: Skin is warm and dry.    ED Course   Procedures (including critical care time) Labs Review Labs Reviewed - No data to display Imaging Review No results found.   MDM   1. Hypertension goal BP (blood pressure) < 140/90        Linna HoffJames D Kindl, MD 10/13/13 [phone]

## 2013-10-13 NOTE — ED Notes (Signed)
Pt  Here  For  Clearance    Of     His blood  Pressure           He  Has  No  pcp         He was  Seen      ucc   sev  Weeks  Ago     For  htn

## 2013-10-28 ENCOUNTER — Ambulatory Visit: Payer: Self-pay

## 2013-11-02 ENCOUNTER — Ambulatory Visit: Payer: Self-pay

## 2013-12-21 ENCOUNTER — Ambulatory Visit: Payer: Self-pay | Admitting: Internal Medicine

## 2014-11-17 ENCOUNTER — Emergency Department (INDEPENDENT_AMBULATORY_CARE_PROVIDER_SITE_OTHER)
Admission: EM | Admit: 2014-11-17 | Discharge: 2014-11-17 | Disposition: A | Discharge status: Home / Self Care | Payer: Self-pay | Source: Home / Self Care | Attending: Emergency Medicine | Admitting: Emergency Medicine

## 2014-11-17 ENCOUNTER — Encounter (HOSPITAL_COMMUNITY): Payer: Self-pay | Admitting: Emergency Medicine

## 2014-11-17 DIAGNOSIS — B86 Scabies: Secondary | ICD-10-CM

## 2014-11-17 MED ORDER — PERMETHRIN 5 % EX CREA: TOPICAL_CREAM | CUTANEOUS | Status: AC

## 2014-11-17 MED ORDER — HYDROXYZINE HCL 25 MG PO TABS: 25.0000 mg | ORAL_TABLET | Freq: Four times a day (QID) | ORAL | Status: AC | PRN

## 2014-11-17 NOTE — ED Notes (Signed)
Pt. Stated, I've got theses bumps on me for about 2 weeks. Bumps are generalized.

## 2014-11-17 NOTE — ED Provider Notes (Signed)
CSN: [id number]     Arrival date & time 11/17/14  1912 History   First MD Initiated Contact with Patient 11/17/14 1932     Chief Complaint  Patient presents with  . Insect Bite   (Consider location/radiation/quality/duration/timing/severity/associated sxs/prior Treatment) HPI He is a 39 year old man here for evaluation of rash. He states for the last 1-2 weeks he has had itchy bumps. They started on his wrists and has spread to his arms, trunk, legs. The itching is worse after a hot shower. His granddaughter had a similar rash a few weeks ago. His wife also reports that she was exposed to scabies about a month ago. She has been asymptomatic.  Past Medical History  Diagnosis Date  . Hypertension    History reviewed. No pertinent past surgical history. No family history on file. History  Substance Use Topics  . Smoking status: Former Games developermoker  . Smokeless tobacco: Not on file  . Alcohol Use: No    Review of Systems As in history of present illness Allergies  Review of patient's allergies indicates no known allergies.  Home Medications   Prior to Admission medications   Medication Sig Start Date End Date Taking? Authorizing Provider  amLODipine (NORVASC) 5 MG tablet Take 1 tablet (5 mg total) by mouth daily. 09/29/13   Mathis FareJennifer Lee H Presson, PA  hydrOXYzine (ATARAX/VISTARIL) 25 MG tablet Take 1 tablet (25 mg total) by mouth every 6 (six) hours as needed for itching. 11/17/14   Charm RingsErin J Kouper Spinella, MD  lisinopril-hydrochlorothiazide (PRINZIDE,ZESTORETIC) 10-12.5 MG per tablet Take 1 tablet by mouth daily. 10/13/13   Linna HoffJames D Kindl, MD  permethrin (ELIMITE) 5 % cream Apply from neck down.  Leave in place 8 hours, then rinse off.  Repeat in 1 week. 11/17/14   Charm RingsErin J Keisha Amer, MD   BP 161/95 mmHg  Pulse 82  Temp(Src) 98 F (36.7 C) (Oral)  Resp 20  SpO2 95% Physical Exam  Constitutional: He is oriented to person, place, and time. He appears well-developed and well-nourished. No distress.   Cardiovascular: Normal rate.   Pulmonary/Chest: Effort normal.  Neurological: He is alert and oriented to person, place, and time.  Skin:  Diffuse flesh-colored papules, primarily on elbows and forearms. There are overlying excoriations.    ED Course  Procedures (including critical care time) Labs Review Labs Reviewed - No data to display  Imaging Review No results found.   MDM   1. Scabies    We'll treat with permethrin. Handout given.    Charm RingsErin J Jeremaine Maraj, MD 11/17/14 [phone]

## 2014-11-17 NOTE — Discharge Instructions (Signed)

## 2016-08-20 ENCOUNTER — Ambulatory Visit: Payer: Self-pay | Attending: Family Medicine | Admitting: Family Medicine

## 2016-08-20 VITALS — BP 133/82 | HR 96 | Temp 97.8°F | Resp 18 | Ht 71.0 in | Wt 276.2 lb

## 2016-08-20 DIAGNOSIS — R0789 Other chest pain: Secondary | ICD-10-CM

## 2016-08-20 DIAGNOSIS — M79605 Pain in left leg: Secondary | ICD-10-CM | POA: Insufficient documentation

## 2016-08-20 DIAGNOSIS — Z0001 Encounter for general adult medical examination with abnormal findings: Secondary | ICD-10-CM | POA: Insufficient documentation

## 2016-08-20 DIAGNOSIS — R071 Chest pain on breathing: Secondary | ICD-10-CM | POA: Insufficient documentation

## 2016-08-20 DIAGNOSIS — I10 Essential (primary) hypertension: Secondary | ICD-10-CM | POA: Insufficient documentation

## 2016-08-20 DIAGNOSIS — Z79899 Other long term (current) drug therapy: Secondary | ICD-10-CM | POA: Insufficient documentation

## 2016-08-20 LAB — BASIC METABOLIC PANEL WITH GFR
BUN: 8 mg/dL (ref 7–25)
CALCIUM: 9.5 mg/dL (ref 8.6–10.3)
CO2: 26 mmol/L (ref 20–31)
Chloride: 104 mmol/L (ref 98–110)
Creat: 0.83 mg/dL (ref 0.60–1.35)
Glucose, Bld: 101 mg/dL — ABNORMAL HIGH (ref 65–99)
POTASSIUM: 4.3 mmol/L (ref 3.5–5.3)
SODIUM: 137 mmol/L (ref 135–146)

## 2016-08-20 MED ORDER — IBUPROFEN 600 MG PO TABS: 600.0000 mg | ORAL_TABLET | Freq: Four times a day (QID) | ORAL | 0 refills | Status: AC | PRN

## 2016-08-20 MED ORDER — HYDROCHLOROTHIAZIDE 25 MG PO TABS: 25.0000 mg | ORAL_TABLET | Freq: Every day | ORAL | 2 refills | Status: AC

## 2016-08-20 MED ORDER — AMLODIPINE BESYLATE 5 MG PO TABS: 5.0000 mg | ORAL_TABLET | Freq: Every day | ORAL | 2 refills | Status: AC

## 2016-08-20 MED ORDER — METHOCARBAMOL 500 MG PO TABS: 500.0000 mg | ORAL_TABLET | Freq: Three times a day (TID) | ORAL | 0 refills | Status: AC | PRN

## 2016-08-20 NOTE — Patient Instructions (Addendum)
Schedule appointment for physical and blood pressure check in 2 weeks.   Hypertension Hypertension is another name for high blood pressure. High blood pressure forces your heart to work harder to pump blood. A blood pressure reading has two numbers, which includes a higher number over a lower number (example: 110/72). Follow these instructions at home:  Have your blood pressure rechecked by your doctor.  Only take medicine as told by your doctor. Follow the directions carefully. The medicine does not work as well if you skip doses. Skipping doses also puts you at risk for problems.  Do not smoke.  Monitor your blood pressure at home as told by your doctor. Contact a doctor if:  You think you are having a reaction to the medicine you are taking.  You have repeat headaches or feel dizzy.  You have puffiness (swelling) in your ankles.  You have trouble with your vision. Get help right away if:  You get a very bad headache and are confused.  You feel weak, numb, or faint.  You get chest or belly (abdominal) pain.  You throw up (vomit).  You cannot breathe very well. This information is not intended to replace advice given to you by your health care provider. Make sure you discuss any questions you have with your health care provider. Document Released: 12/12/2007 Document Revised: 12/01/2015 Document Reviewed: 04/17/2013 Elsevier Interactive Patient Education  2017 [employer]

## 2016-08-20 NOTE — Progress Notes (Signed)
Subjective:  Patient ID: Erik Dean, male    DOB: January 25, 1976  Age: 41 y.o. MRN: [id number]  CC: Establish Care   HPI Erik Dean presents for Hypertension. He reports not taking his antihypertensive medications in one year. He denies finances as a barrier. He states "I felt like I didn't need them". He also reports medication making him feel sluggish. He denies any chest pain, shortness of breath, palpitations, or swelling in the bilateral lower extremities. He also complaints of left knee pain and bilateral rib cage pain for 2 weeks. He reports working a physically demanding job. He denies any history of injury. Reports rib cage pain sensation like a "catch". Pain is worsened with movement denies taking anything for pain.   Outpatient Medications Prior to Visit  Medication Sig Dispense Refill  . hydrOXYzine (ATARAX/VISTARIL) 25 MG tablet Take 1 tablet (25 mg total) by mouth every 6 (six) hours as needed for itching. (Patient not taking: Reported on 08/20/2016) 30 tablet 0  . lisinopril-hydrochlorothiazide (PRINZIDE,ZESTORETIC) 10-12.5 MG per tablet Take 1 tablet by mouth daily. (Patient not taking: Reported on 08/20/2016) 30 tablet 1  . permethrin (ELIMITE) 5 % cream Apply from neck down.  Leave in place 8 hours, then rinse off.  Repeat in 1 week. (Patient not taking: Reported on 08/20/2016) 60 g 1  . amLODipine (NORVASC) 5 MG tablet Take 1 tablet (5 mg total) by mouth daily. (Patient not taking: Reported on 08/20/2016) 30 tablet 1   No facility-administered medications prior to visit.     ROS Review of Systems  Respiratory: Negative.   Cardiovascular: Negative.   Musculoskeletal: Positive for arthralgias.    Objective:  BP 133/82 (BP Location: Left Arm, Patient Position: Sitting, Cuff Size: Large)   Pulse 96   Temp 97.8 F (36.6 C) (Oral)   Resp 18   Ht 5\' 11"  (1.803 m)   Wt 276 lb 3.2 oz (125.3 kg)   SpO2 95%   BMI 38.52 kg/m   BP/Weight 08/20/2016 11/17/2014 10/13/2013    Systolic BP 133 161 152  Diastolic BP 82 95 102  Wt. (Lbs) 276.2 - -  BMI 38.52 - -     Physical Exam  Cardiovascular: Normal rate, regular rhythm, normal heart sounds and intact distal pulses.   Pulmonary/Chest: Effort normal and breath sounds normal.  Abdominal: Soft. Bowel sounds are normal.  Musculoskeletal: Normal range of motion.       Left knee: Tenderness found.  Nursing note and vitals reviewed.   Assessment & Plan:   Problem List Items Addressed This Visit    None    Visit Diagnoses    Essential hypertension    -  Primary   Relevant Medications   amLODipine (NORVASC) 5 MG tablet   hydrochlorothiazide (HYDRODIURIL) 25 MG tablet   Other Relevant Orders   BASIC METABOLIC PANEL WITH GFR (Completed)   Microalbumin/Creatinine Ratio, Urine (Completed)   Left leg pain       Relevant Medications   ibuprofen (ADVIL,MOTRIN) 600 MG tablet   Costochondral pain       Relevant Medications   methocarbamol (ROBAXIN) 500 MG tablet      Meds ordered this encounter  Medications  . amLODipine (NORVASC) 5 MG tablet    Sig: Take 1 tablet (5 mg total) by mouth daily.    Dispense:  30 tablet    Refill:  2    Order Specific Question:   Supervising Provider    Answer:   Jeanann Lewandowsky  E [[id number]]  . hydrochlorothiazide (HYDRODIURIL) 25 MG tablet    Sig: Take 1 tablet (25 mg total) by mouth daily.    Dispense:  30 tablet    Refill:  2    Order Specific Question:   Supervising Provider    Answer:   Quentin AngstJEGEDE, OLUGBEMIGA E [id number]]  . ibuprofen (ADVIL,MOTRIN) 600 MG tablet    Sig: Take 1 tablet (600 mg total) by mouth every 6 (six) hours as needed for mild pain or moderate pain (Take with food.).    Dispense:  30 tablet    Refill:  0    Order Specific Question:   Supervising Provider    Answer:   Quentin AngstJEGEDE, OLUGBEMIGA E [id number]]  . methocarbamol (ROBAXIN) 500 MG tablet    Sig: Take 1 tablet (500 mg total) by mouth every 8 (eight) hours as needed for muscle spasms.     Dispense:  30 tablet    Refill:  0    Order Specific Question:   Supervising Provider    Answer:   Quentin AngstJEGEDE, OLUGBEMIGA E [[id number]]    Follow-up: Return in about 2 weeks (around 09/03/2016) for Annual physical and BP check. Lizbeth Bark.   Mandesia R Hairston FNP

## 2016-08-20 NOTE — Progress Notes (Signed)
Patient is here for establish care   Patient is here for HTN  Patient is not on any current meds today  Patient has not eaten today  Patient declined the flu shot today  Patient denies pain today

## 2016-08-21 LAB — MICROALBUMIN / CREATININE URINE RATIO
Creatinine, Urine: 478 mg/dL — ABNORMAL HIGH (ref 20–370)
MICROALB UR: 3.1 mg/dL
MICROALB/CREAT RATIO: 6 ug/mg{creat} (ref <30)

## 2016-08-27 ENCOUNTER — Telehealth: Payer: Self-pay

## 2016-08-27 NOTE — Telephone Encounter (Signed)
CMA call to go over lab results  Patient did not answer VM full to leave VM

## 2016-08-27 NOTE — Telephone Encounter (Signed)
-----   Message from Lizbeth BarkMandesia R Hairston, [place]NP sent at 08/24/2016  2:45 PM EST ----- Kidney function normal Microalbumin/creatinine ratio level was normal. This tests for protein in your urine that can indicate early signs of kidney damage.

## 2016-09-03 ENCOUNTER — Encounter: Payer: Self-pay | Admitting: Family Medicine
